# Patient Record
Sex: Male | Born: 1990 | Hispanic: Yes | Marital: Single | Smoking: Never smoker
Health system: Southern US, Community
[De-identification: ages and names within clinical notes are randomized; demographics above are authoritative.]

---

## 2014-08-15 ENCOUNTER — Emergency Department (HOSPITAL_COMMUNITY): Payer: Self-pay

## 2014-08-15 ENCOUNTER — Emergency Department (HOSPITAL_COMMUNITY)
Admission: EM | Admit: 2014-08-15 | Discharge: 2014-08-15 | Disposition: A | Discharge status: Home / Self Care | Payer: Self-pay | Attending: Emergency Medicine | Admitting: Emergency Medicine

## 2014-08-15 ENCOUNTER — Encounter (HOSPITAL_COMMUNITY): Payer: Self-pay | Admitting: Emergency Medicine

## 2014-08-15 DIAGNOSIS — K2970 Gastritis, unspecified, without bleeding: Secondary | ICD-10-CM | POA: Insufficient documentation

## 2014-08-15 LAB — BASIC METABOLIC PANEL
ANION GAP: 11 (ref 5–15)
BUN: 16 mg/dL (ref 6–20)
CALCIUM: 9.3 mg/dL (ref 8.9–10.3)
CO2: 24 mmol/L (ref 22–32)
Chloride: 105 mmol/L (ref 101–111)
Creatinine, Ser: 0.9 mg/dL (ref 0.61–1.24)
GFR calc Af Amer: >60 mL/min (ref >60)
Glucose, Bld: 95 mg/dL (ref 70–99)
Potassium: 3.7 mmol/L (ref 3.5–5.1)
SODIUM: 140 mmol/L (ref 135–145)

## 2014-08-15 LAB — CBC
HCT: 43.7 % (ref 39.0–52.0)
Hemoglobin: 15.2 g/dL (ref 13.0–17.0)
MCH: 30.6 pg (ref 26.0–34.0)
MCHC: 34.8 g/dL (ref 30.0–36.0)
MCV: 88.1 fL (ref 78.0–100.0)
Platelets: 238 10*3/uL (ref 150–400)
RBC: 4.96 MIL/uL (ref 4.22–5.81)
RDW: 12.7 % (ref 11.5–15.5)
WBC: 9.6 10*3/uL (ref 4.0–10.5)

## 2014-08-15 LAB — I-STAT TROPONIN, ED: TROPONIN I, POC: 0 ng/mL (ref 0.00–0.08)

## 2014-08-15 MED ORDER — SUCRALFATE 1 G PO TABS: 1.0000 g | ORAL_TABLET | Freq: Two times a day (BID) | ORAL | Status: AC

## 2014-08-15 MED ORDER — GI COCKTAIL ~~LOC~~
30.0000 mL | Freq: Once | ORAL | Status: AC
  Administered 2014-08-15: 30 mL via ORAL
  Filled 2014-08-15: qty 30

## 2014-08-15 MED ORDER — SODIUM CHLORIDE 0.9 % IV BOLUS (SEPSIS)
1000.0000 mL | INTRAVENOUS | Status: AC
  Administered 2014-08-15: 1000 mL via INTRAVENOUS

## 2014-08-15 MED ORDER — ONDANSETRON HCL 4 MG/2ML IJ SOLN
4.0000 mg | INTRAMUSCULAR | Status: AC
  Administered 2014-08-15: 4 mg via INTRAVENOUS
  Filled 2014-08-15: qty 2

## 2014-08-15 MED ORDER — KETOROLAC TROMETHAMINE 30 MG/ML IJ SOLN
30.0000 mg | Freq: Once | INTRAMUSCULAR | Status: AC
  Administered 2014-08-15: 30 mg via INTRAVENOUS
  Filled 2014-08-15: qty 1

## 2014-08-15 MED ORDER — OMEPRAZOLE 20 MG PO CPDR: 20.0000 mg | DELAYED_RELEASE_CAPSULE | Freq: Every day | ORAL | Status: AC

## 2014-08-15 NOTE — ED Notes (Signed)
Pt from home c/o upper abdominal pain to central chest since 1600 yesterday. He describes this pain as pressure and progressively getting worse. He reports alcohol use with last drink being at 0000 5/8. Pt denies nausea, vomiting, or shortness of breath.

## 2014-08-15 NOTE — ED Provider Notes (Signed)
6:10 AM Handoff from Tysinger NP at shift change. Pt with gastroenteritis type symptoms, normal labs. Awaiting completion of fluids. D/c instructions are written up. Plan: d/c when fluids are completed.   7:00 AM Fluids complete. D/c patient to home. Discharge instructions per previous provider.   Renne CriglerJoshua Ricco Dershem, PA-C 08/15/14 [id number]  Mirian MoMatthew Gentry, MD 08/18/14 [phone]

## 2014-08-15 NOTE — ED Provider Notes (Signed)
CSN: [id number]     Arrival date & time 08/15/14  0436 History   First MD Initiated Contact with Patient 08/15/14 [phone]     Chief Complaint  Patient presents with  . Chest Pain   (Consider location/radiation/quality/duration/timing/severity/associated sxs/prior Treatment) HPI  Andrew Fitzpatrick is a 24 yo male presenting with epigastric pain.  He states his pain started 1 day ago after eating pizza.  The pain radiates up his chest and hurts worse when he is laying down. He describes the pain as burning pain and rates it as 10/10.  He went out with friends tonight and after a night of drinking the pain became worse and he vomited once.  He denies any fevers, chills, cough, shortness of breath, diaphoresis, or bilious/bloody emesis.    History reviewed. No pertinent past medical history. History reviewed. No pertinent past surgical history. No family history on file. History  Substance Use Topics  . Smoking status: Never Smoker   . Smokeless tobacco: Not on file  . Alcohol Use: Yes     Comment: social    Review of Systems  Constitutional: Negative for fever and chills.  HENT: Negative for sore throat.   Eyes: Negative for visual disturbance.  Respiratory: Negative for cough and shortness of breath.   Cardiovascular: Negative for chest pain and leg swelling.  Gastrointestinal: Positive for nausea, vomiting and abdominal pain. Negative for diarrhea.  Genitourinary: Negative for dysuria.  Musculoskeletal: Negative for myalgias.  Skin: Negative for rash.  Neurological: Negative for weakness, numbness and headaches.    Allergies  Review of patient's allergies indicates no known allergies.  Home Medications   Prior to Admission medications   Not on File   BP 158/93 mmHg  Pulse 77  Temp(Src) 98.6 F (37 C) (Oral)  Resp 18  Wt 205 lb (92.987 kg)  SpO2 100% Physical Exam  Constitutional: He appears well-developed and well-nourished. No distress.  HENT:  Head:  Normocephalic and atraumatic.  Mouth/Throat: Oropharynx is clear and moist.  Eyes: Conjunctivae are normal.  Neck: Neck supple. No thyromegaly present.  Cardiovascular: Normal rate, regular rhythm and intact distal pulses.   Pulmonary/Chest: Effort normal and breath sounds normal. No respiratory distress. He has no wheezes. He has no rales. He exhibits no tenderness.  Abdominal: Soft. He exhibits no distension and no mass. There is no hepatosplenomegaly. There is tenderness in the epigastric area. There is no rigidity, no rebound, no guarding, no CVA tenderness, no tenderness at McBurney's point and negative Murphy's sign.    Musculoskeletal: He exhibits no tenderness.  Lymphadenopathy:    He has no cervical adenopathy.  Neurological: He is alert.  Skin: Skin is warm and dry. No rash noted. He is not diaphoretic.  Psychiatric: He has a normal mood and affect.  Nursing note and vitals reviewed.   ED Course  Procedures (including critical care time) Labs Review Labs Reviewed  CBC  BASIC METABOLIC PANEL  I-STAT TROPOININ, ED   Imaging Review No results found.   EKG Interpretation None      MDM   Final diagnoses:  Gastritis   24 yo with symptoms of gastritis, pain is limited to epigastric area.  Symptoms improved after initiation of NS bolus and toradol and zofran and GI cocktail. Patient is nontoxic, nonseptic appearing, in no apparent distress.  No indication of appendicitis, bowel obstruction, bowel perforation, cholecystitis, or diverticulitis.   6:00AM: At end of shift, hand-off report [facility], PA-C. Plan includes allowing to finish IVF  and discharge with instruction to start PPI and initiate care with PCP.    Filed Vitals:   08/15/14 0545 08/15/14 0600 08/15/14 0630 08/15/14 0700  BP: 130/69 120/66 113/56 113/58  Pulse: 73 65 65 81  Temp:      TempSrc:      Resp: 19 11 17 18   Weight:      SpO2: 100% 96% 97% 98%   Meds given in ED:  Medications    sodium chloride 0.9 % bolus 1,000 mL (0 mLs Intravenous Stopped 08/15/14 0710)  ketorolac (TORADOL) 30 MG/ML injection 30 mg (30 mg Intravenous Given 08/15/14 0534)  ondansetron (ZOFRAN) injection 4 mg (4 mg Intravenous Given 08/15/14 0534)  gi cocktail (Maalox,Lidocaine,Donnatal) (30 mLs Oral Given 08/15/14 0533)    Discharge Medication List as of 08/15/2014  5:58 AM    START taking these medications   Details  omeprazole (PRILOSEC) 20 MG capsule Take 1 capsule (20 mg total) by mouth daily., Starting 08/15/2014, Until Discontinued, Print    sucralfate (CARAFATE) 1 G tablet Take 1 tablet (1 g total) by mouth 2 (two) times daily., Starting 08/15/2014, Until Discontinued, Print           Harle BattiestElizabeth Glorene Leitzke, NP 08/15/14 1949  Mirian MoMatthew Gentry, MD 08/18/14 [phone]

## 2014-08-15 NOTE — Discharge Instructions (Signed)
Please follow directions provided. Use the resource guide or the referral given to establish care with primary care doctor. Please take the omeprazole daily to help with the stomach pain. Please use the Carafate as directed to help coat your stomach. Try to avoid foods that will irritate your stomach like spicy foods or alcohol. Don't hesitate to return for any new, worsening, or concerning symptoms.   SEEK IMMEDIATE MEDICAL CARE IF:  You have black or dark red stools.  You vomit blood or material that looks like coffee grounds.  You are unable to keep fluids down.  Your abdominal pain gets worse.  You have a fever.  You do not feel better after 1 week.  You have any other questions or concerns.   Emergency Department Resource Guide 1) Find a Doctor and Pay Out of Pocket Although you won't have to find out who is covered by your insurance plan, it is a good idea to ask around and get recommendations. You will then need to call the office and see if the doctor you have chosen will accept you as a new patient and what types of options they offer for patients who are self-pay. Some doctors offer discounts or will set up payment plans for their patients who do not have insurance, but you will need to ask so you aren't surprised when you get to your appointment.  2) Contact Your Local Health Department Not all health departments have doctors that can see patients for sick visits, but many do, so it is worth a call to see if yours does. If you don't know where your local health department is, you can check in your phone book. The CDC also has a tool to help you locate your state's health department, and many state websites also have listings of all of their local health departments.  3) Find a Walk-in Clinic If your illness is not likely to be very severe or complicated, you may want to try a walk in clinic. These are popping up all over the country in pharmacies, drugstores, and shopping centers.  They're usually staffed by nurse practitioners or physician assistants that have been trained to treat common illnesses and complaints. They're usually fairly quick and inexpensive. However, if you have serious medical issues or chronic medical problems, these are probably not your best option.  No Primary Care Doctor: - Call Health Connect at  [phone] - they can help you locate a primary care doctor that  accepts your insurance, provides certain services, etc. - Physician Referral Service- [phone]  Chronic Pain Problems: Organization         Address  Phone   Notes  [facility]  [phone] Patients need to be referred by their primary care doctor.   Medication Assistance: Organization         Address  Phone   Notes  [facility] Medication [facility] Program [address] [place], [place] [phone] --Must be a resident of [place] [facility] -- Must have NO insurance coverage whatsoever (no Medicaid/ Medicare, etc.) -- The pt. MUST have a primary care doctor that directs their care regularly and follows them in the community   MedAssist  [phone]   [employer]  [phone]    Agencies that provide inexpensive medical care: Organization         Address  Phone   Notes  [facility]  [phone]   [facility]    (  [phone]   [facility] [place], [place] [address] [phone]   [place] [address] [place] [phone]   Planned Parenthood    [phone]   [facility]    [phone]   [place] and [address], [place] Phone:  [phone], Fax:  [phone] Hours of Operation:  9 am - 6 pm, M-F.  Also accepts Medicaid/Medicare and self-pay.  [facility] for [place], [address], [place] Phone: [phone], Fax: [phone]. Hours of Operation:  8:30 am - 5:30 pm, M-F.  Also accepts Medicaid and self-pay.  [facility] [place] [address], [place] Phone: [phone]   [place], [place], [place] [phone], Ext. [phone] Mondays & Thursdays: 7-9 AM.  First 15 patients are seen on a first come, first serve basis.    [place] Providers:  Organization         Address  Phone   Notes  [facility] [address], [place] [phone] Also accepts self-pay patients.  [facility] [id number] [place], [place]  [phone]   [place], [address], [place] [phone]   [facility] [address], [place] [phone]   Lucianne Lei [address], [place]   [phone] Only accepts [place] Access [place] patients after they have their name applied to their card.   Self-Pay (no insurance) in [facility]:  Organization         Address  Phone   Notes  Sickle Cell Patients, [facility] Magazine [phone]   [facility]gent Care [place] [phone]   [facility] [place]  [place], [place], [place] [phone]   [facility]/Dr. Osei-Bonsu  [address] [place] or [address], [place] [phone] Phone number for both [place] and [place] locations is the same.  Urgent Medical and [facility] [address], [place] [phone]   [facility] [address] [place] or [address] [phone]   [facility] [address], [place] [phone], phone; [phone], fax Sees patients 1st and 3rd Saturday of every month.  Must not qualify for public or private insurance (i.e.  Medicaid, Medicare, [place] Health Choice, Veterans' Benefits)  Household income should be no more than 200% of the poverty level The clinic cannot treat you if you are pregnant or think you are pregnant  Sexually transmitted diseases are not treated at the clinic.    Dental Care: Organization         Address  Phone  Notes  [facility] of [facility] [place] [phone] Accepts children up to age 54 who are enrolled in [place] or [place]; pregnant women with a Medicaid card; and children who have applied for Medicaid or  Health Choice, but were declined, whose parents can pay a reduced fee at time of service.  [facility] Department of [facility]  [address], [place] [phone] Accepts children up  to age 84 who are enrolled in Medicaid or [place] Health Choice; pregnant women with a Medicaid card; and children who have applied for Medicaid or [place] Health Choice, but were declined, whose parents can pay a reduced fee at time of service.  [place] Adult Dental Access PROGRAM  [place]ellows [phone] Patients are seen by appointment only. Walk-ins are not accepted. [place] will see patients 9 years of age and older. Monday - Tuesday (8am-5pm) Most Wednesdays (8:30-5pm) $30 per visit, cash only  [facility] Adult Dental Access PROGRAM  [address], [facility] [phone] Patients are seen by appointment only. Walk-ins are not accepted. [place] will see patients 65 years of age and older. One Wednesday Evening (Monthly: Volunteer Based).  $30 per visit, cash only  [place]  [phone] for adults; Children under age 30, call Graduate Pediatric Dentistry at [phone]. Children aged 55-14, please call [phone] to request a pediatric application.  Dental services are provided in all areas of dental care including fillings,  crowns and bridges, complete and partial dentures, implants, gum treatment, root canals, and extractions. Preventive care is also provided. Treatment is provided to both adults and children. Patients are selected via a lottery and there is often a waiting list.   [facility] [address] [place]  [phone] [email or web address]   [facility] [address] [place], [place] [phone], Ext. [phone] Second and Fourth Thursday of each month, opens at 6:30 AM; Clinic ends at 9 AM.  Patients are seen on a first-come first-served basis, and a limited number are seen during each clinic.   [facility]  [address]er [place], [place] [phone]   Eligibility Requirements You must have lived in [place], [place], or [place] counties for at least the last three months.   You cannot be eligible for state or federal sponsored [employer], including [employer], [place], or [employer].   You generally cannot be eligible for healthcare insurance through your employer.    How to apply: Eligibility screenings are held every Tuesday and Wednesday afternoon from 1:00 pm until 4:00 pm. You do not need an appointment for the interview!  [facility] [address] [place], [place]   [place]  Brecon Department  [place]  [phone]    Behavioral Health Resources in the Community: Intensive Outpatient Programs Organization         Address  Phone  Notes  [facility] [address] [place], [place] [phone]   [facility] [address], [place], [place]   ADS: Alcohol & Drug Svcs [address], [place], [place]   [place] [address]  [place], [place] or [phone]   Substance Abuse  Resources Organization         Address  Phone  Notes  Alcohol and Drug Services  [phone]   [place]  [phone]   [facility] [place]   Chinita Pester  [phone]   Residential & Outpatient Substance Abuse Program  [phone]   Psychological Services Organization         Address  Phone  Notes  [place]  [place]  [place]   [place]  [address] [place] or [phone]    Mobile Crisis Teams Organization         Address  Phone  Notes  [facility], Mobile Crisis Care Unit  [phone]   [facility]  [address] [place], [place]   [place] [address] [place] [phone]    Self-Help/Support Groups Organization         Address  Phone             Notes  [place]. of [place] - variety of support groups  [place] Call for more information  Narcotics Anonymous (NA), [facility] [address], [employer] [place]  2 meetings at this location   Special educational needs teacher         Address  Phone  Notes  [facility] [place],    [place]  [phone]   [facility]  [address], [place] [phone], [place], [place]   [place], [place] [phone] Admissions: 8am-3pm M-F  Incentives Substance [place] [address],    [place], [place] [phone]   [facility] [address] [place], [place], [place]   [facility] [address]  [place], [place]   [facility] Programs - Intensive Outpatient [address], [place], [place]   [facility] [place].,  [place], [place] [phone] or [phone]   [facility] Services (RTS) [address] [place], [place] Accepts Medicaid  [facility] [place] [address]  [place] [phone] Substance Abuse/Addiction Treatment   [facility] Organization         Address  Phone  Notes  [facility]  [phone]   Domenic Schwab, PhD [address] Arlis Porta [place], [place]   [phone] or [phone]   [facility] [place], [place] [phone]   [facility] [address] [place], [place] [phone] Insurance/Medicaid/sponsorship through [facility] and Families [address]                                    [place], [place] [phone] [place] [address], [place] [phone]    Dr. Adele Schilder  [phone]   [facility] Dept. 1) [address] [place] 2) [place] 3)  [place] [address], [place] [phone]  [phone]   [place] [phone] or [phone] (After Hours)

## 2016-05-26 IMAGING — DX DG CHEST 1V PORT
1 series · 2 of 2 positions shown · non-contrast
Comparison: None.

CLINICAL DATA: Initial evaluation for acute upper abdominal pain
this central chest.

EXAM:
PORTABLE CHEST - 1 VIEW

[Series 1: chest ap · 0.14mm/px · 2 of 2 slices shown]
[im 1/2]
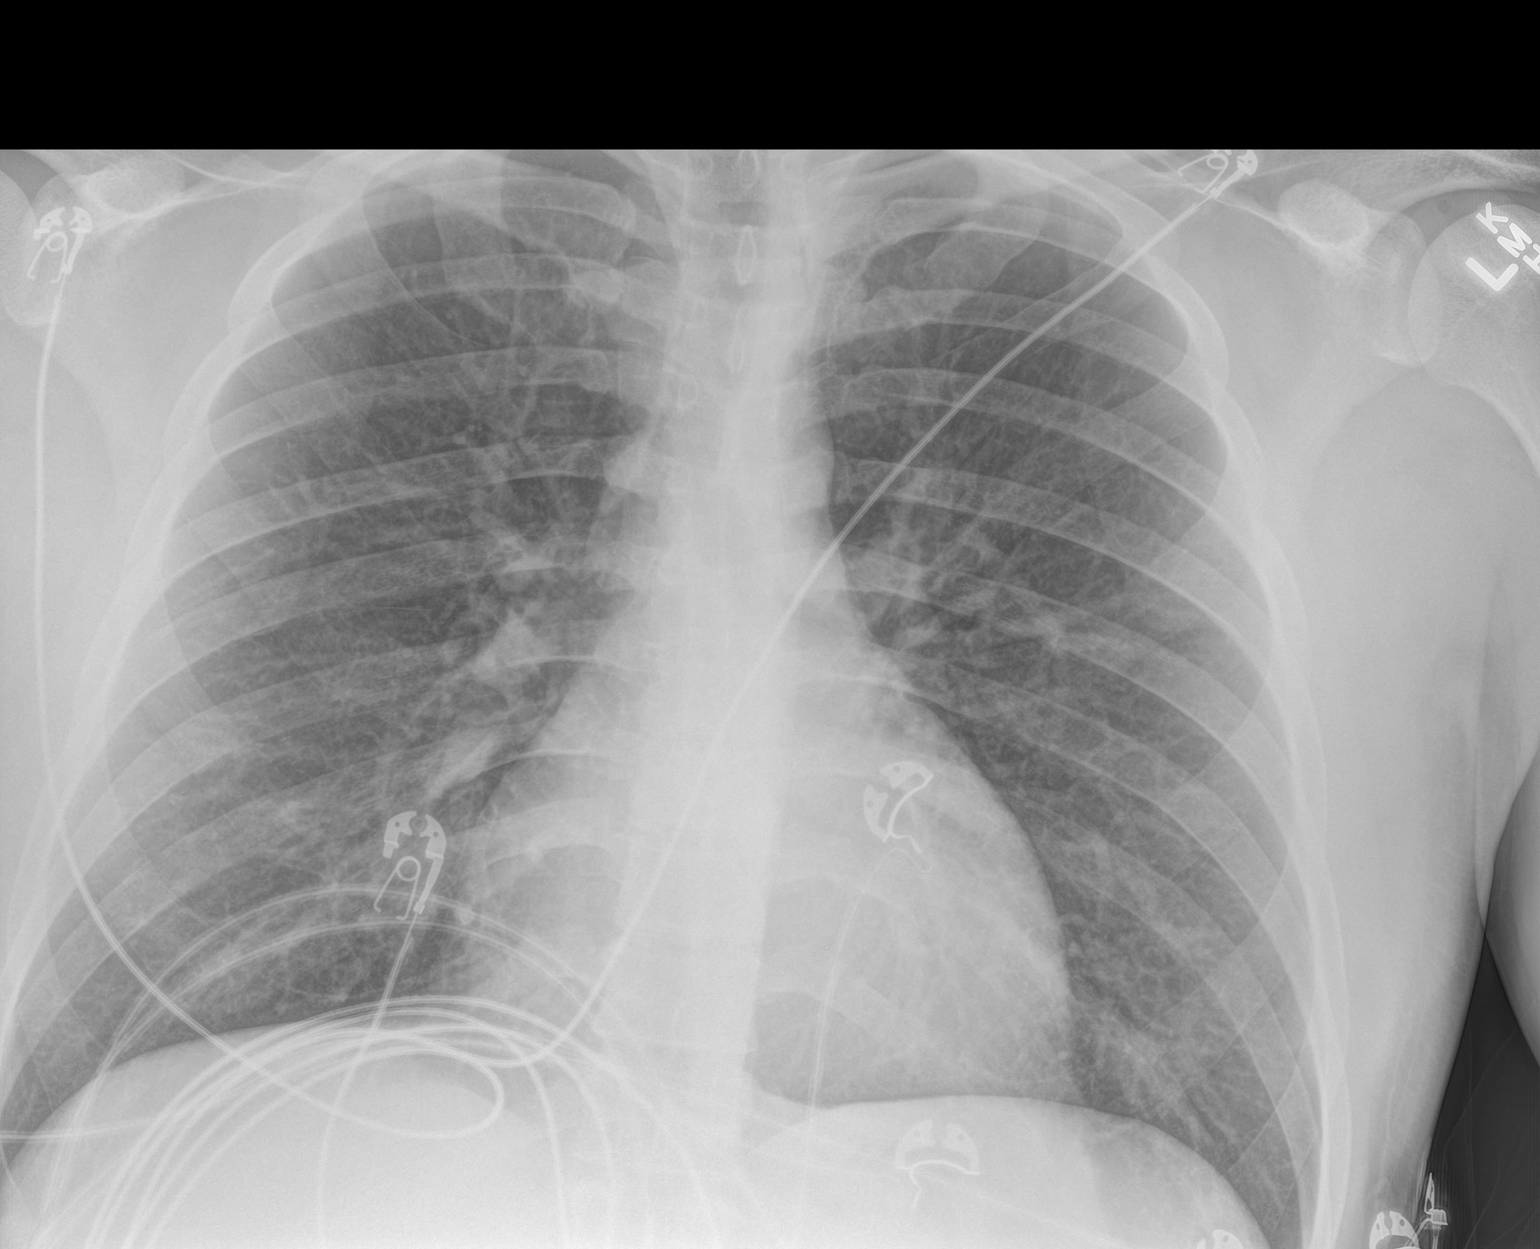
[im 2/2]
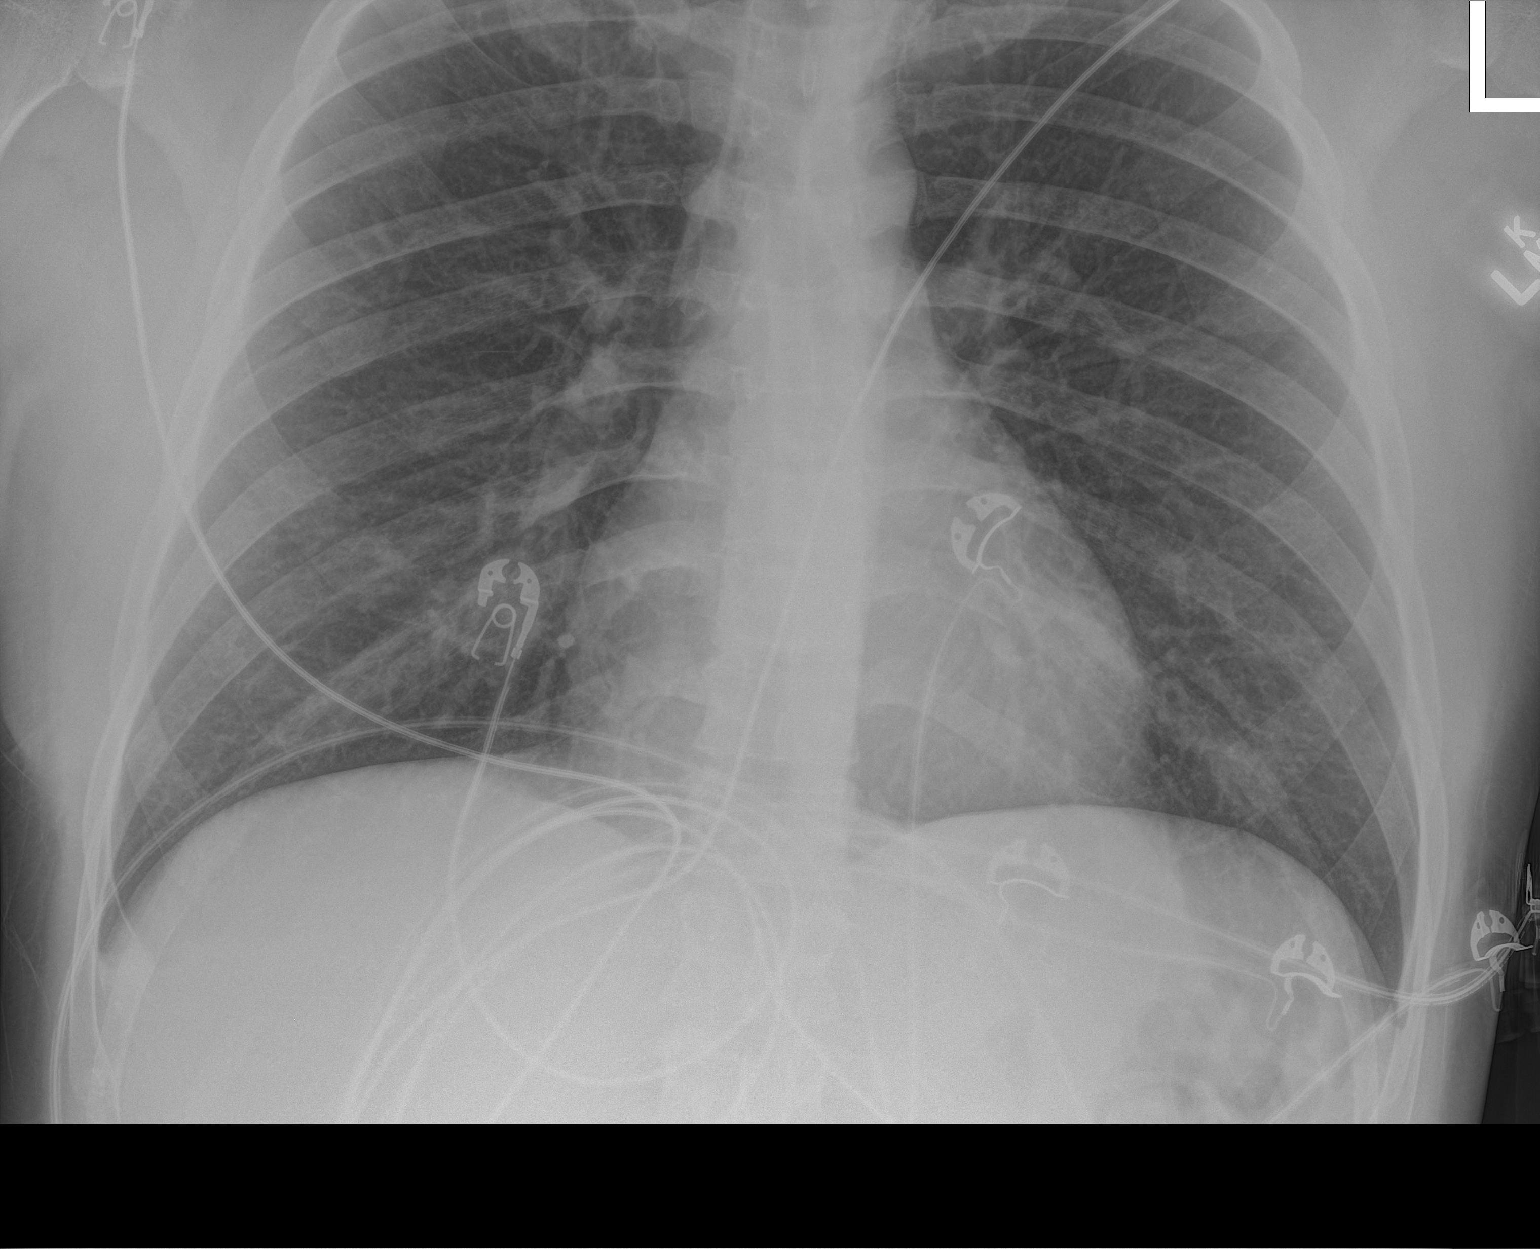

[2 of 2 positions shown; findings below may reference images not displayed]

FINDINGS: The cardiac and mediastinal silhouettes are within normal limits.

The lungs are normally inflated. No airspace consolidation, pleural
effusion, or pulmonary edema is identified. There is no
pneumothorax.

No acute osseous abnormality identified.
IMPRESSION: No active cardiopulmonary disease.
